# Patient Record
Sex: Female | Born: 1985 | Race: White | Hispanic: No | Marital: Single | Smoking: Former smoker
Health system: Southern US, Community
[De-identification: ages and names within clinical notes are randomized; demographics above are authoritative.]

## PROBLEM LIST (undated history)

## (undated) ENCOUNTER — Inpatient Hospital Stay (HOSPITAL_COMMUNITY): Payer: Self-pay

## (undated) DIAGNOSIS — Z789 Other specified health status: Secondary | ICD-10-CM

## (undated) DIAGNOSIS — J45909 Unspecified asthma, uncomplicated: Secondary | ICD-10-CM

## (undated) DIAGNOSIS — F329 Major depressive disorder, single episode, unspecified: Secondary | ICD-10-CM

## (undated) DIAGNOSIS — F32A Depression, unspecified: Secondary | ICD-10-CM

## (undated) DIAGNOSIS — E0590 Thyrotoxicosis, unspecified without thyrotoxic crisis or storm: Secondary | ICD-10-CM

## (undated) HISTORY — PX: ROTATOR CUFF REPAIR: SHX139

## (undated) HISTORY — DX: Thyrotoxicosis, unspecified without thyrotoxic crisis or storm: E05.90

---
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
## 2013-01-26 NOTE — L&D Delivery Note (Signed)
Delivery Note At [address] a healthy female was delivered via spontaneous vaginal delivery (Presentation: ROA;  ).    Placenta status: intact .  Cord: 3 vessel with the following complications: mild shoulder dystocia  Called to bedside when patient was complete and pushing. After 10 minutes, head was delivered OA over intact perineum. No nuchal cord. Shoulders were tight and McRobert's maneuver employed. Suprapubic pressure applied. Attempted to deliver posterior shoulder unsuccessful. Shoulder rotation employed and anterior shoulder delivered after 45 seconds. Posterior shoulder and body delivered immediately thereafter. Cord cut and clamped. Baby taken to warmer for stimulation. With gentle traction, cord and placenta were removed after 5 minutes. Fundal massage employed and patient had some moderate bleeding which slowed. Uterus was mildly boggy and cytotec was given 800mcg rectally. Bleeding slowed significantly. Vaginal laceration was repaired in the vaginal mucosa with 3-0 vicryl suture in 2 layer closure. Lap and instrument count correct. Bleeding was controlled. Mom and baby left in good condition. Ready for transfer to postpartum.  Anesthesia:  epidural Episiotomy: None Lacerations: 2nd degree posterior vaginal Suture Repair: 3.0 vicryl rapide Est. Blood Loss (mL): 350  Mom to postpartum.  Baby to Nursery.  Delivery attended by Dwyane DeeMarie Tritia Endo  Stephens, Devin A 08/26/2013, 3:10 AM  Attended and agree with note Shoulder delivered with SP pressure, then axilla hooked to deliver posterior shoulder Both arms moving spontaneously and equally Aviva SignsMarie L Lyal Husted, CNM

## 2013-04-13 ENCOUNTER — Other Ambulatory Visit (HOSPITAL_COMMUNITY): Payer: Self-pay | Admitting: Nurse Practitioner

## 2013-04-13 DIAGNOSIS — Z3689 Encounter for other specified antenatal screening: Secondary | ICD-10-CM

## 2013-04-13 LAB — OB RESULTS CONSOLE ABO/RH: RH Type: NEGATIVE

## 2013-04-13 LAB — OB RESULTS CONSOLE GC/CHLAMYDIA
CHLAMYDIA, DNA PROBE: NEGATIVE
Gonorrhea: NEGATIVE

## 2013-04-13 LAB — OB RESULTS CONSOLE HIV ANTIBODY (ROUTINE TESTING): HIV: NONREACTIVE

## 2013-04-13 LAB — OB RESULTS CONSOLE RPR: RPR: NONREACTIVE

## 2013-04-13 LAB — OB RESULTS CONSOLE ANTIBODY SCREEN: Antibody Screen: NEGATIVE

## 2013-04-13 LAB — OB RESULTS CONSOLE RUBELLA ANTIBODY, IGM: Rubella: IMMUNE

## 2013-04-13 LAB — OB RESULTS CONSOLE HEPATITIS B SURFACE ANTIGEN: Hepatitis B Surface Ag: NEGATIVE

## 2013-04-18 ENCOUNTER — Ambulatory Visit (HOSPITAL_COMMUNITY)
Admission: RE | Admit: 2013-04-18 | Discharge: 2013-04-18 | Disposition: A | Discharge status: Home / Self Care | Payer: Medicaid Other | Source: Ambulatory Visit | Attending: Nurse Practitioner | Admitting: Nurse Practitioner

## 2013-04-18 DIAGNOSIS — Z3689 Encounter for other specified antenatal screening: Secondary | ICD-10-CM | POA: Insufficient documentation

## 2013-04-18 DIAGNOSIS — O0930 Supervision of pregnancy with insufficient antenatal care, unspecified trimester: Secondary | ICD-10-CM | POA: Insufficient documentation

## 2013-06-25 ENCOUNTER — Encounter (HOSPITAL_COMMUNITY): Payer: Self-pay | Admitting: *Deleted

## 2013-06-25 ENCOUNTER — Inpatient Hospital Stay (HOSPITAL_COMMUNITY)
Admission: AD | Admit: 2013-06-25 | Discharge: 2013-06-25 | Disposition: A | Discharge status: Home / Self Care | Payer: Medicaid Other | Source: Ambulatory Visit | Attending: Obstetrics and Gynecology | Admitting: Obstetrics and Gynecology

## 2013-06-25 DIAGNOSIS — Z87891 Personal history of nicotine dependence: Secondary | ICD-10-CM | POA: Insufficient documentation

## 2013-06-25 DIAGNOSIS — O9989 Other specified diseases and conditions complicating pregnancy, childbirth and the puerperium: Principal | ICD-10-CM

## 2013-06-25 DIAGNOSIS — R109 Unspecified abdominal pain: Secondary | ICD-10-CM | POA: Insufficient documentation

## 2013-06-25 DIAGNOSIS — O99891 Other specified diseases and conditions complicating pregnancy: Secondary | ICD-10-CM | POA: Insufficient documentation

## 2013-06-25 DIAGNOSIS — N949 Unspecified condition associated with female genital organs and menstrual cycle: Secondary | ICD-10-CM

## 2013-06-25 DIAGNOSIS — K219 Gastro-esophageal reflux disease without esophagitis: Secondary | ICD-10-CM | POA: Insufficient documentation

## 2013-06-25 HISTORY — DX: Depression, unspecified: F32.A

## 2013-06-25 HISTORY — DX: Unspecified asthma, uncomplicated: J45.909

## 2013-06-25 HISTORY — DX: Major depressive disorder, single episode, unspecified: F32.9

## 2013-06-25 HISTORY — DX: Other specified health status: Z78.9

## 2013-06-25 LAB — URINALYSIS, ROUTINE W REFLEX MICROSCOPIC
Bilirubin Urine: NEGATIVE
Glucose, UA: NEGATIVE mg/dL
Hgb urine dipstick: NEGATIVE
Ketones, ur: NEGATIVE mg/dL
Leukocytes, UA: NEGATIVE
Nitrite: NEGATIVE
Protein, ur: NEGATIVE mg/dL
Specific Gravity, Urine: 1.01 (ref 1.005–1.030)
Urobilinogen, UA: 0.2 mg/dL (ref 0.0–1.0)
pH: 6.5 (ref 5.0–8.0)

## 2013-06-25 MED ORDER — CYCLOBENZAPRINE HCL 10 MG PO TABS: 10.0000 mg | ORAL_TABLET | Freq: Two times a day (BID) | ORAL | Status: DC | PRN

## 2013-06-25 NOTE — Discharge Instructions (Signed)
Preterm Labor Information Preterm labor is when labor starts at less than 37 weeks of pregnancy. The normal length of a pregnancy is 39 to 41 weeks. CAUSES Often, there is no identifiable underlying cause as to why a woman goes into preterm labor. One of the most common known causes of preterm labor is infection. Infections of the uterus, cervix, vagina, amniotic sac, bladder, kidney, or even the lungs (pneumonia) can cause labor to start. Other suspected causes of preterm labor include:   Urogenital infections, such as yeast infections and bacterial vaginosis.   Uterine abnormalities (uterine shape, uterine septum, fibroids, or bleeding from the placenta).   A cervix that has been operated on (it may fail to stay closed).   Malformations in the fetus.   Multiple gestations (twins, triplets, and so on).   Breakage of the amniotic sac.  RISK FACTORS  Having a previous history of preterm labor.   Having premature rupture of membranes (PROM).   Having a placenta that covers the opening of the cervix (placenta previa).   Having a placenta that separates from the uterus (placental abruption).   Having a cervix that is too weak to hold the fetus in the uterus (incompetent cervix).   Having too much fluid in the amniotic sac (polyhydramnios).   Taking illegal drugs or smoking while pregnant.   Not gaining enough weight while pregnant.   Being younger than 18 and older than 29 years old.   Having a low socioeconomic status.   Being African American. SYMPTOMS Signs and symptoms of preterm labor include:   Menstrual-like cramps, abdominal pain, or back pain.  Uterine contractions that are regular, as frequent as six in an hour, regardless of their intensity (may be mild or painful).  Contractions that start on the top of the uterus and spread down to the lower abdomen and back.   A sense of increased pelvic pressure.   A watery or bloody mucus discharge that  comes from the vagina.  TREATMENT Depending on the length of the pregnancy and other circumstances, your health care provider may suggest bed rest. If necessary, there are medicines that can be given to stop contractions and to mature the fetal lungs. If labor happens before 34 weeks of pregnancy, a prolonged hospital stay may be recommended. Treatment depends on the condition of both you and the fetus.  WHAT SHOULD YOU DO IF YOU THINK YOU ARE IN PRETERM LABOR? Call your health care provider right away. You will need to go to the hospital to get checked immediately. HOW CAN YOU PREVENT PRETERM LABOR IN FUTURE PREGNANCIES? You should:   Stop smoking if you smoke.  Maintain healthy weight gain and avoid chemicals and drugs that are not necessary.  Be watchful for any type of infection.  Inform your health care provider if you have a known history of preterm labor. Document Released: 04/04/2003 Document Revised: 09/14/2012 Document Reviewed: 02/15/2012 [employer] Patient Information 2014 [employer].    

## 2013-06-25 NOTE — MAU Note (Signed)
Pt. States she has noticed an increase in swelling in her legs and feet for the past 4 days. Also, contracting for a couple of days but today got worse and is here to be evaluated. Yesterday had a constant pain in her lower left side. Today right side started to hurt and has noticed a sharp pain that goes down her right leg. Pt. States it is hard to sit up. Denies any leakage of fluid. Denies bleeding. Baby has been moving well. Denies headache. Stacey George

## 2013-06-25 NOTE — MAU Note (Signed)
Pt presents to MAU with complaints of contractions that started a couple of days but have gotten worse today. Denies any vaginal bleeding of LOF

## 2013-06-25 NOTE — MAU Provider Note (Signed)
Attestation of Attending Supervision of Advanced Practitioner: Evaluation and management procedures were performed by the PA/NP/CNM/OB Fellow under my supervision/collaboration. Chart reviewed and agree with management and plan.  Tilda Burrow 06/25/2013 4:35 PM

## 2013-06-25 NOTE — MAU Provider Note (Signed)
None  Chief Complaint:  Contractions  Stacey George is  28 y.o. G1P0 at 6723w4d presents complaining of lower abdominal pain for the last 3 days. Pt reports she would have 6/10 sharp pain on her left or right lower quadrants. The pain will last 5-10 minutes and will improve with either stopping walking or repositioning if sitting down. She has not taken any medications for this pain. She also reports occasionally it will shoot down her right leg.    She denies contractions, vaginal bleeding, or leakage of fluid, with active fetal movement.  No HA, vision changes, CP, SOB, N/v, d/c/,f/c, urinary symptoms. +GERD, no other complaints  Obstetrical/Gynecological History: OB History   Grav Para Term Preterm Abortions TAB SAB Ect Mult Living   1              Past Medical History: Past Medical History  Diagnosis Date  . Medical history non-contributory   . Asthma     exercise induced  . Depression     Past Surgical History: Past Surgical History  Procedure Laterality Date  . Rotator cuff repair  2002 and 2004    Family History: History reviewed. No pertinent family history.  Social History: History  Substance Use Topics  . Smoking status: Former Games developermoker  . Smokeless tobacco: Not on file  . Alcohol Use: No    Allergies:  Allergies  Allergen Reactions  . Codeine Nausea Only and Other (See Comments)    Dizziness    Meds:  Prescriptions prior to admission  Medication Sig Dispense Refill  . Prenatal Vit-Fe Fumarate-FA (MULTIVITAMIN-PRENATAL) 27-0.8 MG TABS tablet Take 1 tablet by mouth daily at 12 noon.        Review of Systems -   Review of Systems  Constitutional: Negative for fever, chills, weight loss, malaise/fatigue and diaphoresis.  HENT: Negative for hearing loss, ear pain, nosebleeds, congestion, sore throat, neck pain, tinnitus and ear discharge.   Eyes: Negative for blurred vision, double vision, photophobia, pain, discharge and redness.  Respiratory: Negative  for cough, hemoptysis, sputum production, shortness of breath, wheezing and stridor.   Cardiovascular: Negative for chest pain, palpitations, orthopnea,  leg swelling  Gastrointestinal: Negative for abdominal pain heartburn, nausea, vomiting, diarrhea, constipation, blood in stool Genitourinary: Negative for dysuria, urgency, frequency, hematuria and flank pain.  Musculoskeletal: Negative for myalgias, back pain, joint pain and falls.  Skin: Negative for itching and rash.  Neurological: Negative for dizziness, tingling, tremors, sensory change, speech change, focal weakness, seizures, loss of consciousness, weakness and headaches.  Endo/Heme/Allergies: Negative for environmental allergies and polydipsia. Does not bruise/bleed easily.  Psychiatric/Behavioral: Negative for depression, suicidal ideas, hallucinations, memory loss and substance abuse. The patient is not nervous/anxious and does not have insomnia.      Physical Exam  Blood pressure 123/74, pulse 100, temperature 98.1 F (36.7 C), resp. rate 18, height 5\' 4"  (1.626 m), weight 88.905 kg (196 lb), last menstrual period 11/09/2012. GENERAL: Well-developed, well-nourished female in no acute distress.  LUNGS: Clear to auscultation bilaterally.  HEART: Regular rate and rhythm. ABDOMEN: Soft, nontender, nondistended, gravid.  EXTREMITIES: Nontender, no edema, 2+ distal pulses. DTR's 2+ Dilation: Closed Effacement (%): Thick Exam by:: Dr. Ike Benedom  FHT:  Baseline rate 150s bpm   Variability moderate  Accelerations present   Decelerations none Contractions: Uterine irritablity   Labs: Results for orders placed during the hospital encounter of 06/25/13 (from the past 24 hour(s))  URINALYSIS, ROUTINE W REFLEX MICROSCOPIC   Collection Time  06/25/13  2:00 PM      Result Value Ref Range   Color, Urine YELLOW  YELLOW   APPearance CLEAR  CLEAR   Specific Gravity, Urine 1.010  1.005 - 1.030   pH 6.5  5.0 - 8.0   Glucose, UA NEGATIVE   NEGATIVE mg/dL   Hgb urine dipstick NEGATIVE  NEGATIVE   Bilirubin Urine NEGATIVE  NEGATIVE   Ketones, ur NEGATIVE  NEGATIVE mg/dL   Protein, ur NEGATIVE  NEGATIVE mg/dL   Urobilinogen, UA 0.2  0.0 - 1.0 mg/dL   Nitrite NEGATIVE  NEGATIVE   Leukocytes, UA NEGATIVE  NEGATIVE   Imaging Studies:  No results found.  Assessment: Stacey George is  28 y.o. G1P0 at [redacted]w[redacted]d presents with pain most consistent with MSK vs Round ligament. Recommend pregnancy belt and Flexeril PRN. Continue PNC with HD. Return for PTL  Minta Balsam 5/31/20152:35 PM

## 2013-07-25 LAB — OB RESULTS CONSOLE GBS: STREP GROUP B AG: POSITIVE

## 2013-08-17 ENCOUNTER — Other Ambulatory Visit (HOSPITAL_COMMUNITY): Payer: Self-pay | Admitting: Nurse Practitioner

## 2013-08-17 DIAGNOSIS — O480 Post-term pregnancy: Secondary | ICD-10-CM

## 2013-08-21 ENCOUNTER — Ambulatory Visit (HOSPITAL_COMMUNITY)
Admission: RE | Admit: 2013-08-21 | Discharge: 2013-08-21 | Disposition: A | Discharge status: Home / Self Care | Payer: Medicaid Other | Source: Ambulatory Visit | Attending: Nurse Practitioner | Admitting: Nurse Practitioner

## 2013-08-21 DIAGNOSIS — Z3689 Encounter for other specified antenatal screening: Secondary | ICD-10-CM | POA: Diagnosis not present

## 2013-08-21 DIAGNOSIS — O480 Post-term pregnancy: Secondary | ICD-10-CM | POA: Insufficient documentation

## 2013-08-23 ENCOUNTER — Encounter (HOSPITAL_COMMUNITY): Payer: Self-pay | Admitting: *Deleted

## 2013-08-23 ENCOUNTER — Telehealth (HOSPITAL_COMMUNITY): Payer: Self-pay | Admitting: *Deleted

## 2013-08-23 NOTE — Telephone Encounter (Signed)
Preadmission screen  

## 2013-08-24 ENCOUNTER — Inpatient Hospital Stay (HOSPITAL_COMMUNITY)
Admission: AD | Admit: 2013-08-24 | Discharge: 2013-08-27 | DRG: 775 | Disposition: A | Discharge status: Home / Self Care | Payer: Medicaid Other | Source: Ambulatory Visit | Attending: Family Medicine | Admitting: Family Medicine

## 2013-08-24 ENCOUNTER — Encounter (HOSPITAL_COMMUNITY): Payer: Self-pay | Admitting: *Deleted

## 2013-08-24 DIAGNOSIS — O99892 Other specified diseases and conditions complicating childbirth: Secondary | ICD-10-CM | POA: Diagnosis present

## 2013-08-24 DIAGNOSIS — O99344 Other mental disorders complicating childbirth: Secondary | ICD-10-CM | POA: Diagnosis present

## 2013-08-24 DIAGNOSIS — O480 Post-term pregnancy: Secondary | ICD-10-CM | POA: Diagnosis present

## 2013-08-24 DIAGNOSIS — J45909 Unspecified asthma, uncomplicated: Secondary | ICD-10-CM | POA: Diagnosis present

## 2013-08-24 DIAGNOSIS — Z6379 Other stressful life events affecting family and household: Secondary | ICD-10-CM

## 2013-08-24 DIAGNOSIS — D649 Anemia, unspecified: Secondary | ICD-10-CM | POA: Diagnosis present

## 2013-08-24 DIAGNOSIS — O9912 Other diseases of the blood and blood-forming organs and certain disorders involving the immune mechanism complicating childbirth: Secondary | ICD-10-CM

## 2013-08-24 DIAGNOSIS — F3289 Other specified depressive episodes: Secondary | ICD-10-CM | POA: Diagnosis present

## 2013-08-24 DIAGNOSIS — Z22330 Carrier of Group B streptococcus: Secondary | ICD-10-CM | POA: Diagnosis not present

## 2013-08-24 DIAGNOSIS — O9902 Anemia complicating childbirth: Secondary | ICD-10-CM | POA: Diagnosis present

## 2013-08-24 DIAGNOSIS — Z87891 Personal history of nicotine dependence: Secondary | ICD-10-CM

## 2013-08-24 DIAGNOSIS — O9989 Other specified diseases and conditions complicating pregnancy, childbirth and the puerperium: Secondary | ICD-10-CM

## 2013-08-24 DIAGNOSIS — E0590 Thyrotoxicosis, unspecified without thyrotoxic crisis or storm: Secondary | ICD-10-CM | POA: Diagnosis present

## 2013-08-24 DIAGNOSIS — Z8249 Family history of ischemic heart disease and other diseases of the circulatory system: Secondary | ICD-10-CM

## 2013-08-24 DIAGNOSIS — D689 Coagulation defect, unspecified: Secondary | ICD-10-CM | POA: Diagnosis present

## 2013-08-24 DIAGNOSIS — Z825 Family history of asthma and other chronic lower respiratory diseases: Secondary | ICD-10-CM

## 2013-08-24 DIAGNOSIS — E079 Disorder of thyroid, unspecified: Secondary | ICD-10-CM | POA: Diagnosis present

## 2013-08-24 DIAGNOSIS — F329 Major depressive disorder, single episode, unspecified: Secondary | ICD-10-CM | POA: Diagnosis present

## 2013-08-24 DIAGNOSIS — O368390 Maternal care for abnormalities of the fetal heart rate or rhythm, unspecified trimester, not applicable or unspecified: Secondary | ICD-10-CM | POA: Diagnosis present

## 2013-08-24 DIAGNOSIS — O481 Prolonged pregnancy: Secondary | ICD-10-CM | POA: Diagnosis present

## 2013-08-24 DIAGNOSIS — O99284 Endocrine, nutritional and metabolic diseases complicating childbirth: Secondary | ICD-10-CM

## 2013-08-24 LAB — CBC
HEMATOCRIT: 27.3 % — AB (ref 36.0–46.0)
Hemoglobin: 8.5 g/dL — ABNORMAL LOW (ref 12.0–15.0)
MCH: 22.9 pg — ABNORMAL LOW (ref 26.0–34.0)
MCHC: 31.1 g/dL (ref 30.0–36.0)
MCV: 73.6 fL — ABNORMAL LOW (ref 78.0–100.0)
Platelets: 355 10*3/uL (ref 150–400)
RBC: 3.71 MIL/uL — ABNORMAL LOW (ref 3.87–5.11)
RDW: 16.9 % — ABNORMAL HIGH (ref 11.5–15.5)
WBC: 10.2 10*3/uL (ref 4.0–10.5)

## 2013-08-24 LAB — RPR

## 2013-08-24 MED ORDER — FLEET ENEMA 7-19 GM/118ML RE ENEM: 1.0000 | ENEMA | Freq: Every day | RECTAL | Status: DC | PRN

## 2013-08-24 MED ORDER — LACTATED RINGERS IV SOLN
500.0000 mL | INTRAVENOUS | Status: DC | PRN
  Administered 2013-08-25: 500 mL via INTRAVENOUS

## 2013-08-24 MED ORDER — ONDANSETRON HCL 4 MG/2ML IJ SOLN: 4.0000 mg | Freq: Four times a day (QID) | INTRAMUSCULAR | Status: DC | PRN

## 2013-08-24 MED ORDER — LACTATED RINGERS IV SOLN
INTRAVENOUS | Status: DC
  Administered 2013-08-24 – 2013-08-26 (×6): via INTRAVENOUS

## 2013-08-24 MED ORDER — PENICILLIN G POTASSIUM 5000000 UNITS IJ SOLR
2.5000 10*6.[IU] | INTRAVENOUS | Status: DC
  Filled 2013-08-24 (×2): qty 2.5

## 2013-08-24 MED ORDER — OXYTOCIN BOLUS FROM INFUSION
500.0000 mL | INTRAVENOUS | Status: DC
  Administered 2013-08-26: 500 mL via INTRAVENOUS

## 2013-08-24 MED ORDER — FENTANYL CITRATE 0.05 MG/ML IJ SOLN
100.0000 ug | INTRAMUSCULAR | Status: DC | PRN
  Administered 2013-08-24 – 2013-08-25 (×4): 100 ug via INTRAVENOUS
  Filled 2013-08-24 (×5): qty 2

## 2013-08-24 MED ORDER — ACETAMINOPHEN 325 MG PO TABS: 650.0000 mg | ORAL_TABLET | ORAL | Status: DC | PRN

## 2013-08-24 MED ORDER — OXYCODONE-ACETAMINOPHEN 5-325 MG PO TABS: 1.0000 | ORAL_TABLET | ORAL | Status: DC | PRN

## 2013-08-24 MED ORDER — ZOLPIDEM TARTRATE 5 MG PO TABS
5.0000 mg | ORAL_TABLET | Freq: Every evening | ORAL | Status: DC | PRN
  Administered 2013-08-25: 5 mg via ORAL
  Filled 2013-08-24: qty 1

## 2013-08-24 MED ORDER — PENICILLIN G POTASSIUM 5000000 UNITS IJ SOLR
2.5000 10*6.[IU] | INTRAVENOUS | Status: DC
  Administered 2013-08-25 – 2013-08-26 (×4): 2.5 10*6.[IU] via INTRAVENOUS
  Filled 2013-08-24 (×8): qty 2.5

## 2013-08-24 MED ORDER — LIDOCAINE HCL (PF) 1 % IJ SOLN
30.0000 mL | INTRAMUSCULAR | Status: DC | PRN
  Filled 2013-08-24: qty 30

## 2013-08-24 MED ORDER — PENICILLIN G POTASSIUM 5000000 UNITS IJ SOLR
5.0000 10*6.[IU] | Freq: Once | INTRAVENOUS | Status: AC
  Administered 2013-08-25: 5 10*6.[IU] via INTRAVENOUS
  Filled 2013-08-24: qty 5

## 2013-08-24 MED ORDER — PENICILLIN G POTASSIUM 5000000 UNITS IJ SOLR
5.0000 10*6.[IU] | Freq: Once | INTRAVENOUS | Status: DC
  Filled 2013-08-24: qty 5

## 2013-08-24 MED ORDER — OXYTOCIN 40 UNITS IN LACTATED RINGERS INFUSION - SIMPLE MED: 62.5000 mL/h | INTRAVENOUS | Status: DC

## 2013-08-24 MED ORDER — CITRIC ACID-SODIUM CITRATE 334-500 MG/5ML PO SOLN
30.0000 mL | ORAL | Status: DC | PRN
  Administered 2013-08-25: 30 mL via ORAL
  Filled 2013-08-24: qty 15

## 2013-08-24 MED ORDER — IBUPROFEN 600 MG PO TABS
600.0000 mg | ORAL_TABLET | Freq: Four times a day (QID) | ORAL | Status: DC | PRN
  Administered 2013-08-26: 600 mg via ORAL
  Filled 2013-08-24: qty 1

## 2013-08-24 NOTE — MAU Note (Signed)
[facility] Pt states she was being monitored and fetal heart rate was 180-190  For more than 

## 2013-08-24 NOTE — H&P (Signed)
Attestation of Attending Supervision of Advanced Practitioner (CNM/NP): Evaluation and management procedures were performed by the Advanced Practitioner under my supervision and collaboration.  I have reviewed the Advanced Practitioner's note and chart, and I agree with the management and plan.  Posie Lillibridge 08/24/2013 8:37 PM   

## 2013-08-24 NOTE — H&P (Signed)
LABOR ADMISSION HISTORY AND PHYSICAL  Stacey George is a 28 y.o. female G1P0 with IUP at [redacted]w[redacted]d by LMP c/w with 22w sono presenting for fetal heart tones 180s-190s in clinic today for about 10 minutes, NST initially nonreactive, after discussing with patient she agreed to be admitted for IOL for prolonged pregnancy. She reports +FMs, No LOF, no VB, no blurry vision, headaches or peripheral edema, and RUQ pain. She desires an epidural for labor pain control. She plans on breast and bottle feeding. She declines birth control.  Dating: By LMP --->  Estimated Date of Delivery: 08/16/13   Prenatal History/Complications:  Past Medical History: Past Medical History  Diagnosis Date  . Medical history non-contributory   . Asthma     exercise induced  . Depression   . Hyperthyroidism     Past Surgical History: Past Surgical History  Procedure Laterality Date  . Rotator cuff repair  2002 and 2004    Obstetrical History: OB History   Grav Para Term Preterm Abortions TAB SAB Ect Mult Living   1               Social History: History   Social History  . Marital Status: Single    Spouse Name: N/A    Number of Children: N/A  . Years of Education: N/A   Social History Main Topics  . Smoking status: Former Games developer  . Smokeless tobacco: None  . Alcohol Use: No  . Drug Use: No  . Sexual Activity: None   Other Topics Concern  . None   Social History Narrative  . None    Family History: Family History  Problem Relation Age of Onset  . Asthma Mother   . Hypertension Mother   . Hypothyroidism Mother   . Fibromyalgia Mother   . Hypertension Father   . Drug abuse Maternal Grandmother   . Hypertension Paternal Grandmother   . Drug abuse Paternal Grandmother   . Cancer Paternal Grandmother   . Hypertension Paternal Grandfather   . Cancer Paternal Grandfather     Allergies: Allergies  Allergen Reactions  . Codeine Nausea Only and Other (See Comments)    Dizziness     Prescriptions prior to admission  Medication Sig Dispense Refill  . acetaminophen (TYLENOL) 500 MG tablet Take 1,500 mg by mouth every 6 (six) hours as needed for headache (Used for severe headache.).      Marland Kitchen calcium carbonate (TUMS - DOSED IN MG ELEMENTAL CALCIUM) 500 MG chewable tablet Chew 2 tablets by mouth daily as needed for indigestion or heartburn.      . Prenatal Vit-Fe Fumarate-FA (MULTIVITAMIN-PRENATAL) 27-0.8 MG TABS tablet Take 1 tablet by mouth daily at 12 noon.         Review of Systems   All systems reviewed and negative except as stated in HPI  Blood pressure 125/88, pulse 97, temperature 98.1 F (36.7 C), temperature source Oral, resp. rate 18, height 5\' 4"  (1.626 m), weight 95.255 kg (210 lb), last menstrual period 11/09/2012. General appearance: alert and cooperative Lungs: clear to auscultation bilaterally Heart: regular rate and rhythm Abdomen: soft, non-tender; bowel sounds normal Pelvic: 2/70/-3, VERY soft, anterior Extremities: Homans sign is negative, no sign of DVT DTR's not examined Presentation: cephalic Fetal monitoringBaseline: 150 bpm, mod variability, initially no accels but now with accels, no decels Uterine activityNone Dilation: 2 Effacement (%): 60 Station: -3 Exam by:: Dr Loreta Ave   Prenatal labs: ABO, Rh: A/Negative/-- (03/19 0000)  Antibody: Negative (03/19 0000)  Rubella:   immune RPR: Nonreactive (03/19 0000)  HBsAg: Negative (03/19 0000)  HIV: Non-reactive (03/19 0000)  GBS: Positive (06/30 0000)  1 hr Glucola 124 Genetic screening  Too late Anatomy US normal   Prenatal Transfer Tool  Maternal Diabetes: No Genetic Screening: Declined Maternal Ultrasounds/Referrals: Normal Fetal Ultrasounds or other Referrals:  None Maternal Substance Abuse:  Yes:  Type: Smoker Significant Maternal Medications:  None Significant Maternal Lab Results: None     Results for orders placed during the hospital encounter of 08/24/13 (from the  past 24 hour(s))  CBC   Collection Time    08/24/13  5:25 PM      Result Value Ref Range   WBC 10.2  4.0 - 10.5 K/uL   RBC 3.71 (*) 3.87 - 5.11 MIL/uL   Hemoglobin 8.5 (*) 12.0 - 15.0 g/dL   HCT 16.127.3 (*) 09.636.0 - 04.546.0 %   MCV 73.6 (*) 78.0 - 100.0 fL   MCH 22.9 (*) 26.0 - 34.0 pg   MCHC 31.1  30.0 - 36.0 g/dL   RDW 40.916.9 (*) 81.111.5 - 91.415.5 %   Platelets 355  150 - 400 K/uL    Patient Active Problem List   Diagnosis Date Noted  . Prolonged pregnancy 08/24/2013    Assessment: Stacey George is a 28 y.o. G1P0 at 2676w1d here for IOL 2/2 prolonged pregnancy and nonreactive NST   #Labor: IOL with FB at 1830 #Pain: Epidural upon request #FWB: Cat 1 #ID:  PCN #MOF: breast #MOC:declines  Stacey George 08/24/2013, 7:21 PM

## 2013-08-25 ENCOUNTER — Encounter (HOSPITAL_COMMUNITY): Payer: Medicaid Other | Admitting: Anesthesiology

## 2013-08-25 ENCOUNTER — Inpatient Hospital Stay (HOSPITAL_COMMUNITY): Payer: Medicaid Other | Admitting: Anesthesiology

## 2013-08-25 MED ORDER — DIPHENHYDRAMINE HCL 50 MG/ML IJ SOLN: 12.5000 mg | INTRAMUSCULAR | Status: DC | PRN

## 2013-08-25 MED ORDER — LACTATED RINGERS IV SOLN
500.0000 mL | Freq: Once | INTRAVENOUS | Status: AC
  Administered 2013-08-25: 500 mL via INTRAVENOUS

## 2013-08-25 MED ORDER — OXYTOCIN 40 UNITS IN LACTATED RINGERS INFUSION - SIMPLE MED
1.0000 m[IU]/min | INTRAVENOUS | Status: DC
  Administered 2013-08-25: 2 m[IU]/min via INTRAVENOUS
  Filled 2013-08-25: qty 1000

## 2013-08-25 MED ORDER — FENTANYL CITRATE 0.05 MG/ML IJ SOLN
100.0000 ug | INTRAMUSCULAR | Status: DC | PRN
  Administered 2013-08-25: 100 ug via INTRAVENOUS

## 2013-08-25 MED ORDER — LIDOCAINE HCL (PF) 1 % IJ SOLN
INTRAMUSCULAR | Status: DC | PRN
  Administered 2013-08-25 (×4): 4 mL

## 2013-08-25 MED ORDER — EPHEDRINE 5 MG/ML INJ
10.0000 mg | INTRAVENOUS | Status: DC | PRN
  Filled 2013-08-25: qty 2

## 2013-08-25 MED ORDER — PHENYLEPHRINE 40 MCG/ML (10ML) SYRINGE FOR IV PUSH (FOR BLOOD PRESSURE SUPPORT)
80.0000 ug | PREFILLED_SYRINGE | INTRAVENOUS | Status: DC | PRN
  Filled 2013-08-25: qty 10
  Filled 2013-08-25: qty 2

## 2013-08-25 MED ORDER — ZOLPIDEM TARTRATE 5 MG PO TABS: 5.0000 mg | ORAL_TABLET | Freq: Every evening | ORAL | Status: DC | PRN

## 2013-08-25 MED ORDER — FENTANYL 2.5 MCG/ML BUPIVACAINE 1/10 % EPIDURAL INFUSION (WH - ANES)
14.0000 mL/h | INTRAMUSCULAR | Status: DC | PRN
  Administered 2013-08-25 – 2013-08-26 (×3): 14 mL/h via EPIDURAL
  Filled 2013-08-25 (×3): qty 125

## 2013-08-25 MED ORDER — PHENYLEPHRINE 40 MCG/ML (10ML) SYRINGE FOR IV PUSH (FOR BLOOD PRESSURE SUPPORT)
80.0000 ug | PREFILLED_SYRINGE | INTRAVENOUS | Status: DC | PRN
  Filled 2013-08-25: qty 2

## 2013-08-25 NOTE — Progress Notes (Signed)
Stacey George is a [age] y.o. G1P0 at 6519w2d by ultrasound admitted for induction of labor due to Post dates. .  Subjective: Comfortable  Objective: BP 130/74  Pulse 103  Temp(Src) 98.2 F (36.8 C) (Oral)  Resp 18  Ht 5\' 4"  (1.626 m)  Wt 95.255 kg (210 lb)  BMI 36.03 kg/m2  SpO2 100%  LMP 11/09/2012 I/O last 3 completed shifts: In: 3372.6 [I.V.:3372.6] Out: 700 [Urine:700] Total I/O In: 197.5 [I.V.:197.5] Out: -   FHT:  FHR: 140 bpm, variability: moderate,  accelerations:  Present,  decelerations:  Absent UC:   regular, every 3 minutes SVE:   Dilation: 5.5 Effacement (%): 80 Station: -1 Exam by:: L.Stubbs, RN  Labs: Lab Results  Component Value Date   WBC 10.2 08/24/2013   HGB 8.5* 08/24/2013   HCT 27.3* 08/24/2013   MCV 73.6* 08/24/2013   PLT 355 08/24/2013    Assessment / Plan: Induction of labor due to postterm,  progressing well on pitocin  Labor: Progressing normally Preeclampsia:  n/a Fetal Wellbeing:  Category I Pain Control:  Epidural I/D:  n/a Anticipated MOD:  NSVD  Stacey George 08/25/2013, 9:27 PM

## 2013-08-25 NOTE — Anesthesia Procedure Notes (Signed)
Epidural Patient location during procedure: OB Start time: 08/25/2013 4:20 PM  Staffing Performed by: anesthesiologist   Preanesthetic Checklist Completed: patient identified, site marked, surgical consent, pre-op evaluation, timeout performed, IV checked, risks and benefits discussed and monitors and equipment checked  Epidural Patient position: sitting Prep: site prepped and draped and DuraPrep Patient monitoring: continuous pulse ox and blood pressure Approach: midline Injection technique: LOR air  Needle:  Needle type: Tuohy  Needle gauge: 17 G Needle length: 9 cm and 9 Needle insertion depth: 6 cm Catheter type: closed end flexible Catheter size: 19 Gauge Catheter at skin depth: 11 cm Test dose: negative  Assessment Events: blood not aspirated, injection not painful, no injection resistance, negative IV test and no paresthesia  Additional Notes Discussed risk of headache, infection, bleeding, nerve injury and failed or incomplete block.  Patient voices understanding and wishes to proceed.  Epidural placed easily on first attempt.  No paresthesia.  Patient tolerated procedure well with no apparent complications. Jasmine DecemberA. Georgia Delsignore, MDReason for block:procedure for pain

## 2013-08-25 NOTE — Progress Notes (Signed)
  Subjective: Reports lower back pain and requesting IV pain med if pitocin to be increased  Objective: BP 117/59  Pulse 99  Temp(Src) 98.5 F (36.9 C) (Oral)  Resp 20  Ht 5\' 4"  (1.626 m)  Wt 95.255 kg (210 lb)  BMI 36.03 kg/m2  SpO2 100%  LMP 11/09/2012 I/O last 3 completed shifts: In: -  Out: 700 [Urine:700]    FHT:  FHR: 150's bpm, variability: moderate,  accelerations:  Present,  decelerations:  Absent UC:   Regular q3-464min SVE:   Dilation: 4 Effacement (%): 80 Station: -1 Exam by:: Loreta AveAcosta, MD IUPC placed without difficulty  Labs: Lab Results  Component Value Date   WBC 10.2 08/24/2013   HGB 8.5* 08/24/2013   HCT 27.3* 08/24/2013   MCV 73.6* 08/24/2013   PLT 355 08/24/2013    Assessment / Plan: 28 yo G1P0 at 5157w2d wks IUP IOL for postdates and nonreactive NST  Labor: Progressing normally , continue pitocin Preeclampsia:  n/a Fetal Wellbeing:  Category I Pain Control:  IV pain meds. I/D:  GBS pos Anticipated MOD:  NSVD  Stacey George 08/25/2013, 7:47 PM

## 2013-08-25 NOTE — Progress Notes (Signed)
  Subjective: Reports lower back pain and requesting IV pain med if pitocin to be increased  Objective: BP 118/68  Pulse 101  Temp(Src) 97.7 F (36.5 C) (Oral)  Resp 18  Ht 5\' 4"  (1.626 m)  Wt 95.255 kg (210 lb)  BMI 36.03 kg/m2  LMP 11/09/2012      FHT:  FHR: 150's bpm, variability: moderate,  accelerations:  Present,  decelerations:  Absent UC:   Regular q3-754min SVE:   Dilation: 4 Effacement (%): 80 Station: -3 Exam by:: Loreta AveAcosta, MD  Labs: Lab Results  Component Value Date   WBC 10.2 08/24/2013   HGB 8.5* 08/24/2013   HCT 27.3* 08/24/2013   MCV 73.6* 08/24/2013   PLT 355 08/24/2013    Assessment / Plan: 28 yo G1P0 at 8119w2d wks IUP IOL for postdates and nonreactive NST  Labor: Progressing normally , continue pitocin Preeclampsia:  n/a Fetal Wellbeing:  Category I Pain Control:  IV pain meds. I/D:  GBS pos Anticipated MOD:  NSVD  Stacey George ROCIO 08/25/2013, 1:25 PM

## 2013-08-25 NOTE — Progress Notes (Signed)
   Stacey George is a 28 y.o. G1P0 at 3850w2d  admitted for induction of labor due to Post dates..  Subjective:  sleeping Objective: Filed Vitals:   08/24/13 2010 08/24/13 2239 08/25/13 0105 08/25/13 0508  BP: 110/65 118/74 108/64 116/71  Pulse: 97 100 101 97  Temp:  97.9 F (36.6 C) 98.5 F (36.9 C) 97.5 F (36.4 C)  TempSrc:  Oral Oral Oral  Resp: 18 18 18 18   Height:      Weight:          FHT:  FHR: 140 bpm, variability: moderate,  accelerations:  Present,  decelerations:  Absent UC:   rare SVE: deferred.  Foley still in  Labs: Lab Results  Component Value Date   WBC 10.2 08/24/2013   HGB 8.5* 08/24/2013   HCT 27.3* 08/24/2013   MCV 73.6* 08/24/2013   PLT 355 08/24/2013    Assessment / Plan: ripening phase  Labor: ripening phase Fetal Wellbeing:  Category I Pain Control:  Labor support without medications Anticipated MOD:  NSVD  CRESENZO-DISHMAN,Nyashia Raney 08/25/2013, 5:41 AM

## 2013-08-25 NOTE — Progress Notes (Addendum)
  Subjective: Pt denies feeling any contractions or cramping.  Reports lower back pain and requesting IV pain med.    Objective: BP 102/51  Pulse 94  Temp(Src) 98.2 F (36.8 C) (Oral)  Resp 16  Ht 5\' 4"  (1.626 m)  Wt 95.255 kg (210 lb)  BMI 36.03 kg/m2  LMP 11/09/2012      FHT:  FHR: 120's bpm, variability: moderate,  accelerations:  Present,  decelerations:  Absent UC:   irregular, every 2-8 minutes SVE:   Dilation: 3.5 Effacement (%): Thick Station: -3 Exam by:: Muhammad, CNM ; foley bulb removed  Labs: Lab Results  Component Value Date   WBC 10.2 08/24/2013   HGB 8.5* 08/24/2013   HCT 27.3* 08/24/2013   MCV 73.6* 08/24/2013   PLT 355 08/24/2013    Assessment / Plan: 28 yo G1P0 at 6255w2d wks IUP IOL for postdates and nonreactive NST  Labor: Progressing normally; Begin pitocin augmentation Preeclampsia:  George/a Fetal Wellbeing:  Category I Pain Control:  IV pain meds. I/D:  GBS pos Anticipated MOD:  NSVD  Stacey George, Stacey George 08/25/2013, 9:09 AM

## 2013-08-25 NOTE — Anesthesia Preprocedure Evaluation (Addendum)
Anesthesia Evaluation  Patient identified by MRN, date of birth, ID band Patient awake    Reviewed: Allergy & Precautions, H&P , NPO status , Patient's Chart, lab work & pertinent test results, reviewed documented beta blocker date and time   History of Anesthesia Complications Negative for: history of anesthetic complications  Airway Mallampati: I TM Distance: >3 FB Neck ROM: full    Dental  (+) Teeth Intact   Pulmonary asthma (exercise-induced) , former smoker,  breath sounds clear to auscultation        Cardiovascular negative cardio ROS  Rhythm:regular Rate:Normal     Neuro/Psych Depression negative neurological ROS     GI/Hepatic negative GI ROS, Neg liver ROS,   Endo/Other  Hyperthyroidism BMI 36.1  Renal/GU negative Renal ROS     Musculoskeletal   Abdominal   Peds  Hematology  (+) Blood dyscrasia (hgb 8.5), anemia ,   Anesthesia Other Findings   Reproductive/Obstetrics (+) Pregnancy                          Anesthesia Physical Anesthesia Plan  ASA: II  Anesthesia Plan: Epidural   Post-op Pain Management:    Induction:   Airway Management Planned:   Additional Equipment:   Intra-op Plan:   Post-operative Plan:   Informed Consent: I have reviewed the patients History and Physical, chart, labs and discussed the procedure including the risks, benefits and alternatives for the proposed anesthesia with the patient or authorized representative who has indicated his/her understanding and acceptance.     Plan Discussed with:   Anesthesia Plan Comments:         Anesthesia Quick Evaluation

## 2013-08-26 ENCOUNTER — Encounter (HOSPITAL_COMMUNITY): Payer: Self-pay

## 2013-08-26 ENCOUNTER — Inpatient Hospital Stay (HOSPITAL_COMMUNITY): Admission: RE | Admit: 2013-08-26 | Payer: Medicaid Other | Source: Ambulatory Visit

## 2013-08-26 DIAGNOSIS — O480 Post-term pregnancy: Secondary | ICD-10-CM

## 2013-08-26 DIAGNOSIS — O9912 Other diseases of the blood and blood-forming organs and certain disorders involving the immune mechanism complicating childbirth: Secondary | ICD-10-CM

## 2013-08-26 DIAGNOSIS — D689 Coagulation defect, unspecified: Secondary | ICD-10-CM

## 2013-08-26 LAB — CBC
HEMATOCRIT: 22.9 % — AB (ref 36.0–46.0)
HEMOGLOBIN: 7.2 g/dL — AB (ref 12.0–15.0)
MCH: 22.9 pg — ABNORMAL LOW (ref 26.0–34.0)
MCHC: 31.4 g/dL (ref 30.0–36.0)
MCV: 72.9 fL — ABNORMAL LOW (ref 78.0–100.0)
Platelets: 298 10*3/uL (ref 150–400)
RBC: 3.14 MIL/uL — ABNORMAL LOW (ref 3.87–5.11)
RDW: 17.1 % — ABNORMAL HIGH (ref 11.5–15.5)
WBC: 27.4 10*3/uL — AB (ref 4.0–10.5)

## 2013-08-26 MED ORDER — DIPHENHYDRAMINE HCL 25 MG PO CAPS
25.0000 mg | ORAL_CAPSULE | Freq: Four times a day (QID) | ORAL | Status: DC | PRN
Start: 2013-08-26 — End: 2013-08-27

## 2013-08-26 MED ORDER — LANOLIN HYDROUS EX OINT: TOPICAL_OINTMENT | CUTANEOUS | Status: DC | PRN

## 2013-08-26 MED ORDER — ONDANSETRON HCL 4 MG PO TABS: 4.0000 mg | ORAL_TABLET | ORAL | Status: DC | PRN

## 2013-08-26 MED ORDER — SIMETHICONE 80 MG PO CHEW: 80.0000 mg | CHEWABLE_TABLET | ORAL | Status: DC | PRN

## 2013-08-26 MED ORDER — MISOPROSTOL 200 MCG PO TABS
ORAL_TABLET | ORAL | Status: AC
  Filled 2013-08-26: qty 4

## 2013-08-26 MED ORDER — OXYCODONE-ACETAMINOPHEN 5-325 MG PO TABS
1.0000 | ORAL_TABLET | ORAL | Status: DC | PRN
  Administered 2013-08-26: 1 via ORAL
  Administered 2013-08-26: 2 via ORAL
  Administered 2013-08-26: 1 via ORAL
  Administered 2013-08-27: 2 via ORAL
  Filled 2013-08-26: qty 2
  Filled 2013-08-26 (×2): qty 1
  Filled 2013-08-26: qty 2

## 2013-08-26 MED ORDER — TETANUS-DIPHTH-ACELL PERTUSSIS 5-2.5-18.5 LF-MCG/0.5 IM SUSP: 0.5000 mL | Freq: Once | INTRAMUSCULAR | Status: DC

## 2013-08-26 MED ORDER — DIBUCAINE 1 % RE OINT: 1.0000 "application " | TOPICAL_OINTMENT | RECTAL | Status: DC | PRN

## 2013-08-26 MED ORDER — ONDANSETRON HCL 4 MG/2ML IJ SOLN: 4.0000 mg | INTRAMUSCULAR | Status: DC | PRN

## 2013-08-26 MED ORDER — PRENATAL MULTIVITAMIN CH
1.0000 | ORAL_TABLET | Freq: Every day | ORAL | Status: DC
  Administered 2013-08-26 – 2013-08-27 (×2): 1 via ORAL
  Filled 2013-08-26 (×2): qty 1

## 2013-08-26 MED ORDER — IBUPROFEN 600 MG PO TABS
600.0000 mg | ORAL_TABLET | Freq: Four times a day (QID) | ORAL | Status: DC
Start: 2013-08-26 — End: 2013-08-27
  Administered 2013-08-26 – 2013-08-27 (×5): 600 mg via ORAL
  Filled 2013-08-26 (×5): qty 1

## 2013-08-26 MED ORDER — WITCH HAZEL-GLYCERIN EX PADS: 1.0000 "application " | MEDICATED_PAD | CUTANEOUS | Status: DC | PRN

## 2013-08-26 MED ORDER — BENZOCAINE-MENTHOL 20-0.5 % EX AERO
1.0000 "application " | INHALATION_SPRAY | CUTANEOUS | Status: DC | PRN
  Administered 2013-08-26: 1 via TOPICAL
  Filled 2013-08-26: qty 56

## 2013-08-26 MED ORDER — MISOPROSTOL 200 MCG PO TABS
800.0000 ug | ORAL_TABLET | Freq: Once | ORAL | Status: AC
  Administered 2013-08-26: 800 ug via VAGINAL

## 2013-08-26 MED ORDER — SENNOSIDES-DOCUSATE SODIUM 8.6-50 MG PO TABS
2.0000 | ORAL_TABLET | ORAL | Status: DC
  Administered 2013-08-26: 2 via ORAL
  Filled 2013-08-26: qty 2

## 2013-08-26 MED ORDER — ZOLPIDEM TARTRATE 5 MG PO TABS: 5.0000 mg | ORAL_TABLET | Freq: Every evening | ORAL | Status: DC | PRN

## 2013-08-26 NOTE — Progress Notes (Signed)
Clinical Social Work Department PSYCHOSOCIAL ASSESSMENT - MATERNAL/CHILD 08/26/2013  Patient:  Stacey George,Stacey George  Account Number:  [id number]  Admit Date:  08/24/2013  Marjo Bickerhilds Name:   Stacey George    Clinical Social Worker:  Theona Muhs, LCSW   Date/Time:  08/26/2013 03:30 PM  Date Referred:  08/26/2013   Referral source  Central Nursery     Referred reason  Depression/Anxiety   Other referral source:    I:  FAMILY / HOME ENVIRONMENT Child's legal guardian:  PARENT  Guardian - Name Guardian - Age Guardian - Address  Stacey George,Stacey George [address]  [place], [place] [address]   Other household support members/support persons Other support:   Maternal grandparents    II  PSYCHOSOCIAL DATA Information Source:    Event organiserinancial and Community Resources Employment:   Mother is employed   Surveyor, quantityinancial resources:  [employer]icaid If [place]:   Other  WIC   School / Grade:   Maternity Care Coordinator / Child Services Coordination / Early Interventions:  Cultural issues impacting care:    III  STRENGTHS Strengths  Home prepared for Child (including basic supplies)  Supportive family/friends  Adequate Resources   Strength comment:    IV  RISK FACTORS AND CURRENT PROBLEMS Current Problem:       V  SOCIAL WORK ASSESSMENT Acknowledged order for Social Work consult to assess mother's history of depression.  She is a single parent with no other dependents.  FOB is reportedly uninvolved and she expects no support from him.  Mother resides with maternal grandparents.  Maternal great grandparents were present during CSW and very attentive to newborn.  Mother is employed and reports plan to return to work.  She admits to hx of depression while in college and states that she received brief counseling at the time.  She denies any recurring episodes.  She denies any current symptoms. Spoke with her regarding signs/symptoms of PP Depression, and provided her with information on where she  could seek treatment if needed.  She denies any hx of substance abuse. No acute social concerns related at this time.    Mother informed of social work Surveyor, miningavailability.      VI SOCIAL WORK PLAN Social Work Plan  No Further Intervention Required / No Barriers to Discharge   Type of pt/family education:   PP Depression information and resource

## 2013-08-26 NOTE — Anesthesia Postprocedure Evaluation (Signed)
Anesthesia Post Note  Patient: Stacey George  Procedure(s) Performed: * No procedures listed *  Anesthesia type: Epidural  Patient location: Mother/Baby  Post pain: Pain level controlled  Post assessment: Post-op Vital signs reviewed  Last Vitals:  Filed Vitals:   08/26/13 0600  BP: 123/61  Pulse: 94  Temp: 37.2 C  Resp: 20    Post vital signs: Reviewed  Level of consciousness:alert  Complications: No apparent anesthesia complications

## 2013-08-26 NOTE — Progress Notes (Signed)
Delivery of live viable female at [id number] by Dr. Zonia KiefStephens and Judie PetitM. Mayford KnifeWilliams, CNM.

## 2013-08-27 MED ORDER — IBUPROFEN 600 MG PO TABS: 600.0000 mg | ORAL_TABLET | Freq: Four times a day (QID) | ORAL | Status: DC

## 2013-08-27 MED ORDER — RHO D IMMUNE GLOBULIN 1500 UNIT/2ML IJ SOSY
300.0000 ug | PREFILLED_SYRINGE | Freq: Once | INTRAMUSCULAR | Status: AC
  Administered 2013-08-27: 300 ug via INTRAMUSCULAR
  Filled 2013-08-27: qty 2

## 2013-08-27 NOTE — Discharge Summary (Signed)
Attestation of Attending Supervision of Advanced Practitioner (CNM/NP): Evaluation and management procedures were performed by the Advanced Practitioner under my supervision and collaboration.  I have reviewed the Advanced Practitioner's note and chart, and I agree with the management and plan.  Anairis Knick 08/27/2013 12:05 PM   

## 2013-08-27 NOTE — Discharge Instructions (Signed)
Vaginal Delivery, Care After  Refer to this sheet in the next few weeks. These discharge instructions provide you with information on caring for yourself after delivery. Your caregiver may also give you specific instructions. Your treatment has been planned according to the most current medical practices available, but problems sometimes occur. Call your caregiver if you have any problems or questions after you go home.  HOME CARE INSTRUCTIONS  Take over-the-counter or prescription medicines only as directed by your caregiver or pharmacist.  Do not drink alcohol, especially if you are breastfeeding or taking medicine to relieve pain.  Do not chew or smoke tobacco.  Do not use illegal drugs.  Continue to use good perineal care. Good perineal care includes:  Wiping your perineum from front to back.  Keeping your perineum clean. Do not use tampons or douche until your caregiver says it is okay.  Shower, wash your hair, and take tub baths as directed by your caregiver.  Wear a well-fitting bra that provides breast support.  Eat healthy foods.  Drink enough fluids to keep your urine clear or pale yellow.  Eat high-fiber foods such as whole grain cereals and breads, brown rice, beans, and fresh fruits and vegetables every day. These foods may help prevent or relieve constipation.  Follow your caregiver's recommendations regarding resumption of activities such as climbing stairs, driving, lifting, exercising, or traveling.  Talk to your caregiver about resuming sexual activities. Resumption of sexual activities is dependent upon your risk of infection, your rate of healing, and your comfort and desire to resume sexual activity.  Try to have someone help you with your household activities and your newborn for at least a few days after you leave the hospital.  Rest as much as possible. Try to rest or take a nap when your newborn is sleeping.  Increase your activities gradually.  Keep all of your  scheduled postpartum appointments. It is very important to keep your scheduled follow-up appointments. At these appointments, your caregiver will be checking to make sure that you are healing physically and emotionally. SEEK MEDICAL CARE IF:  You are passing large clots from your vagina. Save any clots to show your caregiver.  You have a foul smelling discharge from your vagina.  You have trouble urinating.  You are urinating frequently.  You have pain when you urinate.  You have a change in your bowel movements.  You have increasing redness, pain, or swelling near your vaginal incision (episiotomy) or vaginal tear.  You have pus draining from your episiotomy or vaginal tear.  Your episiotomy or vaginal tear is separating.  You have painful, hard, or reddened breasts.  You have a severe headache.  You have blurred vision or see spots.  You feel sad or depressed.  You have thoughts of hurting yourself or your newborn.  You have questions about your care, the care of your newborn, or medicines.  You are dizzy or light-headed.  You have a rash.  You have nausea or vomiting.  You were breastfeeding and have not had a menstrual period within 12 weeks after you stopped breastfeeding.  You are not breastfeeding and have not had a menstrual period by the 12th week after delivery.  You have a fever. SEEK IMMEDIATE MEDICAL CARE IF:  You have persistent pain.  You have chest pain.  You have shortness of breath.  You faint.  You have leg pain.  You have stomach pain.  Your vaginal bleeding saturates two or more sanitary pads in  1 hour. MAKE SURE YOU:  Understand these instructions.  Will watch your condition.  Will get help right away if you are not doing well or get worse. Document Released: 01/10/2000 Document Revised: 05/29/2013 Document Reviewed: 09/09/2011  [facility] Patient Information 2015 [place]. This information is not intended to replace advice given to you by your health  care provider. Make sure you discuss any questions you have with your health care provider.   Vaginal Delivery, Care After Refer to this sheet in the next few weeks. These discharge instructions provide you with information on caring for yourself after delivery. Your caregiver may also give you specific instructions. Your treatment has been planned according to the most current medical practices available, but problems sometimes occur. Call your caregiver if you have any problems or questions after you go home. HOME CARE INSTRUCTIONS  Take over-the-counter or prescription medicines only as directed by your caregiver or pharmacist.  Do not drink alcohol, especially if you are breastfeeding or taking medicine to relieve pain.  Do not chew or smoke tobacco.  Do not use illegal drugs.  Continue to use good perineal care. Good perineal care includes:  Wiping your perineum from front to back.  Keeping your perineum clean.  Do not use tampons or douche until your caregiver says it is okay.  Shower, wash your hair, and take tub baths as directed by your caregiver.  Wear a well-fitting bra that provides breast support.  Eat healthy foods.  Drink enough fluids to keep your urine clear or pale yellow.  Eat high-fiber foods such as whole grain cereals and breads, brown rice, beans, and fresh fruits and vegetables every day. These foods may help prevent or relieve constipation.  Follow your caregiver's recommendations regarding resumption of activities such as climbing stairs, driving, lifting, exercising, or traveling.  Talk to your caregiver about resuming sexual activities. Resumption of sexual activities is dependent upon your risk of infection, your rate of healing, and your comfort and desire to resume sexual activity.  Try to have someone help you with your household activities and your newborn for at least a few days after you leave the hospital.  Rest as much as possible. Try to  rest or take a nap when your newborn is sleeping.  Increase your activities gradually.  Keep all of your scheduled postpartum appointments. It is very important to keep your scheduled follow-up appointments. At these appointments, your caregiver will be checking to make sure that you are healing physically and emotionally. SEEK MEDICAL CARE IF:   You are passing large clots from your vagina. Save any clots to show your caregiver.  You have a foul smelling discharge from your vagina.  You have trouble urinating.  You are urinating frequently.  You have pain when you urinate.  You have a change in your bowel movements.  You have increasing redness, pain, or swelling near your vaginal incision (episiotomy) or vaginal tear.  You have pus draining from your episiotomy or vaginal tear.  Your episiotomy or vaginal tear is separating.  You have painful, hard, or reddened breasts.  You have a severe headache.  You have blurred vision or see spots.  You feel sad or depressed.  You have thoughts of hurting yourself or your newborn.  You have questions about your care, the care of your newborn, or medicines.  You are dizzy or light-headed.  You have a rash.  You have nausea or vomiting.  You were breastfeeding and have not had a  menstrual period within 12 weeks after you stopped breastfeeding.  You are not breastfeeding and have not had a menstrual period by the 12th week after delivery.  You have a fever. SEEK IMMEDIATE MEDICAL CARE IF:   You have persistent pain.  You have chest pain.  You have shortness of breath.  You faint.  You have leg pain.  You have stomach pain.  Your vaginal bleeding saturates two or more sanitary pads in 1 hour. MAKE SURE YOU:   Understand these instructions.  Will watch your condition.  Will get help right away if you are not doing well or get worse. Document Released: 01/10/2000 Document Revised: 05/29/2013 Document Reviewed:  09/09/2011 [facility] Patient Information 2015 [place], [place]. This information is not intended to replace advice given to you by your health care provider. Make sure you discuss any questions you have with your health care provider.

## 2013-08-27 NOTE — Lactation Note (Signed)
This note was copied from the chart of Stacey Claybon Jabsara Barsch. Lactation Consultation Note Mom holding baby; states baby just fed; mom states breastfeeding has been going much better; states no concerns; denies breast, nipple pain. Mom was instructed in the prevention and treatment of engorgement and sore nipples. Mom was encouraged to call lactation department if she has any questions or concerns and to attend the breastfeeding support group.   Patient Name: Stacey Claybon Jabsara Brandi YQMVH'[id number]'s Date: 08/27/2013     Maternal Data    Feeding Feeding Type: Breast Fed Length of feed: 20 min  LATCH Score/Interventions                      Lactation Tools Discussed/Used     Consult Status      Lenard ForthSanders, Marria Mathison Fulmer 08/27/2013, 11:43 AM

## 2013-08-27 NOTE — Discharge Summary (Signed)
Obstetric Discharge Summary Reason for Admission: Induction of labor for prolonged pregnancy Prenatal Procedures: none Intrapartum Procedures: spontaneous vaginal delivery Postpartum Procedures: none Complications-Operative and Postpartum: vaginal laceration Hemoglobin  Date Value Ref Range Status  08/26/2013 7.2* 12.0 - 15.0 g/dL Final     HCT  Date Value Ref Range Status  08/26/2013 22.9* 36.0 - 46.0 % Final   28 year old G1P1001 at 2036w3d who was admitted for induction of labor, now s/p vaginal delivery. She underwent cervical ripening with foley bulb followed by pitocin for induction. Patient had good response to induction and had vaginal delivery at 0240 on 08/26/2013. Delivery was complicated by shoulder dystocia that was delivered after 45 seconds with suprapubic pressure. There was a posterior vaginal laceration that was repaired with 3-0 vicryl in simple 2 layer closure. Bleeding was 350mL at delivery and minimal after delivery. Pain was controlled. Patient had good recovery in the next 24 hours. She declined any contraception at discharge. Followup with health department in 4-6 weeks.  Physical Exam:  General: alert, cooperative and no distress Lochia: appropriate Uterine Fundus: firm Incision: n/a DVT Evaluation: No evidence of DVT seen on physical exam. Negative Homan's sign. No cords or calf tenderness.  Discharge Diagnoses: Term Pregnancy-delivered  Discharge Information: Date: 08/27/2013 Activity: pelvic rest Diet: routine Medications: Ibuprofen Condition: stable Instructions: Contact physician for any worsening pain, bleeding, fever, nausea Discharge to: home Follow-up Information   Follow up with PRATT,TANYA S, MD. Schedule an appointment as soon as possible for a visit in 4 weeks.   Specialty:  Obstetrics and Gynecology   Contact information:   [address] [place] [address] [phone]       Newborn Data: Live born female  Birth Weight: 8 lb 0.8 oz  (3650 g) APGAR: 7, 8  Home with mother.  Patient seen with Shaniece Bussa Verne GrainPoe Stephens, Devin A 08/27/2013, 7:49 AM  Evaluation and management procedures were performed by Resident physician under my supervision/collaboration. Chart reviewed, patient examined by me and I agree with management and plan. Danae Orleanseirdre C Leidi Astle, CNM 08/27/2013 9:57 AM

## 2013-08-28 LAB — TYPE AND SCREEN
ABO/RH(D): A NEG
Antibody Screen: POSITIVE
DAT, IgG: NEGATIVE
UNIT DIVISION: 0
Unit division: 0

## 2013-08-28 LAB — RH IG WORKUP (INCLUDES ABO/RH)
ABO/RH(D): A NEG
Fetal Screen: NEGATIVE
Gestational Age(Wks): 41.3
UNIT DIVISION: 0

## 2013-08-30 ENCOUNTER — Inpatient Hospital Stay (HOSPITAL_COMMUNITY)
Admission: AD | Admit: 2013-08-30 | Discharge: 2013-08-30 | Disposition: A | Discharge status: Home / Self Care | Payer: Medicaid Other | Source: Ambulatory Visit | Attending: Obstetrics & Gynecology | Admitting: Obstetrics & Gynecology

## 2013-08-30 ENCOUNTER — Encounter (HOSPITAL_COMMUNITY): Payer: Self-pay | Admitting: *Deleted

## 2013-08-30 DIAGNOSIS — O9081 Anemia of the puerperium: Secondary | ICD-10-CM

## 2013-08-30 DIAGNOSIS — R51 Headache: Secondary | ICD-10-CM | POA: Diagnosis present

## 2013-08-30 DIAGNOSIS — O99893 Other specified diseases and conditions complicating puerperium: Secondary | ICD-10-CM | POA: Diagnosis not present

## 2013-08-30 DIAGNOSIS — O1205 Gestational edema, complicating the puerperium: Secondary | ICD-10-CM

## 2013-08-30 DIAGNOSIS — O9989 Other specified diseases and conditions complicating pregnancy, childbirth and the puerperium: Principal | ICD-10-CM

## 2013-08-30 DIAGNOSIS — D649 Anemia, unspecified: Secondary | ICD-10-CM

## 2013-08-30 DIAGNOSIS — R300 Dysuria: Secondary | ICD-10-CM | POA: Insufficient documentation

## 2013-08-30 LAB — URINALYSIS, ROUTINE W REFLEX MICROSCOPIC
BILIRUBIN URINE: NEGATIVE
Bilirubin Urine: NEGATIVE
GLUCOSE, UA: NEGATIVE mg/dL
Glucose, UA: NEGATIVE mg/dL
HGB URINE DIPSTICK: NEGATIVE
KETONES UR: NEGATIVE mg/dL
Ketones, ur: NEGATIVE mg/dL
Leukocytes, UA: NEGATIVE
Nitrite: NEGATIVE
Nitrite: NEGATIVE
PH: 6.5 (ref 5.0–8.0)
PROTEIN: 100 mg/dL — AB
Protein, ur: NEGATIVE mg/dL
Specific Gravity, Urine: 1.01 (ref 1.005–1.030)
Specific Gravity, Urine: 1.015 (ref 1.005–1.030)
Urobilinogen, UA: 0.2 mg/dL (ref 0.0–1.0)
Urobilinogen, UA: 0.2 mg/dL (ref 0.0–1.0)
pH: 6 (ref 5.0–8.0)

## 2013-08-30 LAB — CBC
HEMATOCRIT: 21.1 % — AB (ref 36.0–46.0)
Hemoglobin: 6.4 g/dL — CL (ref 12.0–15.0)
MCH: 22.5 pg — ABNORMAL LOW (ref 26.0–34.0)
MCHC: 30.3 g/dL (ref 30.0–36.0)
MCV: 74 fL — ABNORMAL LOW (ref 78.0–100.0)
PLATELETS: 364 10*3/uL (ref 150–400)
RBC: 2.85 MIL/uL — ABNORMAL LOW (ref 3.87–5.11)
RDW: 17.6 % — AB (ref 11.5–15.5)
WBC: 9.5 10*3/uL (ref 4.0–10.5)

## 2013-08-30 LAB — COMPREHENSIVE METABOLIC PANEL
ALK PHOS: 124 U/L — AB (ref 39–117)
ALT: 21 U/L (ref 0–35)
AST: 22 U/L (ref 0–37)
Albumin: 2.7 g/dL — ABNORMAL LOW (ref 3.5–5.2)
Anion gap: 12 (ref 5–15)
BUN: 15 mg/dL (ref 6–23)
CHLORIDE: 105 meq/L (ref 96–112)
CO2: 21 mEq/L (ref 19–32)
Calcium: 8.4 mg/dL (ref 8.4–10.5)
Creatinine, Ser: 0.64 mg/dL (ref 0.50–1.10)
GFR calc Af Amer: >90 mL/min (ref >90)
GFR calc non Af Amer: >90 mL/min (ref >90)
Glucose, Bld: 98 mg/dL (ref 70–99)
POTASSIUM: 3.8 meq/L (ref 3.7–5.3)
SODIUM: 138 meq/L (ref 137–147)
TOTAL PROTEIN: 6.4 g/dL (ref 6.0–8.3)

## 2013-08-30 LAB — URINE MICROSCOPIC-ADD ON

## 2013-08-30 LAB — PROTEIN / CREATININE RATIO, URINE
Creatinine, Urine: 114.58 mg/dL
PROTEIN CREATININE RATIO: 0.06 (ref 0.00–0.15)
Total Protein, Urine: 7.2 mg/dL

## 2013-08-30 MED ORDER — FERROUS SULFATE 325 (65 FE) MG PO TABS: 325.0000 mg | ORAL_TABLET | Freq: Three times a day (TID) | ORAL | Status: AC

## 2013-08-30 NOTE — MAU Provider Note (Signed)
Chief Complaint: Headache and Edema   First Provider Initiated Contact with Patient 08/30/13 1948     SUBJECTIVE HPI: Stacey George is a 28 y.o. G1P1001 who presents to maternity admissions on Day 4 postpartum following NSVD reporting headache with onset today and swelling of both ankles and feet worsening since discharge from the hospital.  She reports labor lasted 36 hours and she had continuous IV fluids during that time.  She reports moderate lochia, mild abdominal cramping, and denies vaginal itching/burning, urinary symptoms, dizziness, n/v, or fever/chills.     Past Medical History  Diagnosis Date  . Medical history non-contributory   . Asthma     exercise induced  . Depression   . Hyperthyroidism    Past Surgical History  Procedure Laterality Date  . Rotator cuff repair  2002 and 2004   History   Social History  . Marital Status: Single    Spouse Name: N/A    Number of Children: N/A  . Years of Education: N/A   Occupational History  . Not on file.   Social History Main Topics  . Smoking status: Former Games developer  . Smokeless tobacco: Not on file  . Alcohol Use: No  . Drug Use: No  . Sexual Activity: Not on file   Other Topics Concern  . Not on file   Social History Narrative  . No narrative on file   No current facility-administered medications on file prior to encounter.   Current Outpatient Prescriptions on File Prior to Encounter  Medication Sig Dispense Refill  . calcium carbonate (TUMS - DOSED IN MG ELEMENTAL CALCIUM) 500 MG chewable tablet Chew 3 tablets by mouth 5 (five) times daily as needed for indigestion or heartburn.       . Prenatal Vit-Fe Fumarate-FA (MULTIVITAMIN-PRENATAL) 27-0.8 MG TABS tablet Take 1 tablet by mouth daily.        Allergies  Allergen Reactions  . Codeine Nausea Only    ROS: Pertinent items in HPI  OBJECTIVE Blood pressure 132/90, pulse 79, temperature 98.6 F (37 C), temperature source Oral, resp. rate 18, height 5\' 4"   (1.626 m), weight 94.257 kg (207 lb 12.8 oz), last menstrual period 11/09/2012, unknown if currently breastfeeding. Patient Vitals for the past 24 hrs:  BP Temp Temp src Pulse Resp Height Weight  08/30/13 1727 132/90 mmHg 98.6 F (37 C) Oral 79 18 5\' 4"  (1.626 m) 94.257 kg (207 lb 12.8 oz)   GENERAL: Well-developed, well-nourished female in no acute distress.  HEENT: Normocephalic HEART: normal rate, heart sounds, regular rhythm RESP: normal effort, lung sounds clear and equal bilaterally ABDOMEN: Soft, non-tender EXTREMITIES: Nontender, +2 nonpitting edema BLE, DTRs +2 NEURO: Alert and oriented   LAB RESULTS Results for orders placed during the hospital encounter of 08/30/13 (from the past 24 hour(s))  URINALYSIS, ROUTINE W REFLEX MICROSCOPIC     Status: Abnormal   Collection Time    08/30/13  5:30 PM      Result Value Ref Range   Color, Urine RED (*) YELLOW   APPearance CLOUDY (*) CLEAR   Specific Gravity, Urine 1.010  1.005 - 1.030   pH 6.5  5.0 - 8.0   Glucose, UA NEGATIVE  NEGATIVE mg/dL   Hgb urine dipstick LARGE (*) NEGATIVE   Bilirubin Urine NEGATIVE  NEGATIVE   Ketones, ur NEGATIVE  NEGATIVE mg/dL   Protein, ur 409 (*) NEGATIVE mg/dL   Urobilinogen, UA 0.2  0.0 - 1.0 mg/dL   Nitrite NEGATIVE  NEGATIVE  Leukocytes, UA LARGE (*) NEGATIVE  URINE MICROSCOPIC-ADD ON     Status: Abnormal   Collection Time    08/30/13  5:30 PM      Result Value Ref Range   Squamous Epithelial / LPF FEW (*) RARE   WBC, UA 21-50  <3 WBC/hpf   RBC / HPF TOO NUMEROUS TO COUNT  <3 RBC/hpf   Bacteria, UA FEW (*) RARE  CBC     Status: Abnormal   Collection Time    08/30/13  7:00 PM      Result Value Ref Range   WBC 9.5  4.0 - 10.5 K/uL   RBC 2.85 (*) 3.87 - 5.11 MIL/uL   Hemoglobin 6.4 (*) 12.0 - 15.0 g/dL   HCT 62.1 (*) 30.8 - 65.7 %   MCV 74.0 (*) 78.0 - 100.0 fL   MCH 22.5 (*) 26.0 - 34.0 pg   MCHC 30.3  30.0 - 36.0 g/dL   RDW 84.6 (*) 96.2 - 95.2 %   Platelets 364  150 - 400 K/uL   COMPREHENSIVE METABOLIC PANEL     Status: Abnormal   Collection Time    08/30/13  7:00 PM      Result Value Ref Range   Sodium 138  137 - 147 mEq/L   Potassium 3.8  3.7 - 5.3 mEq/L   Chloride 105  96 - 112 mEq/L   CO2 21  19 - 32 mEq/L   Glucose, Bld 98  70 - 99 mg/dL   BUN 15  6 - 23 mg/dL   Creatinine, Ser 8.41  0.50 - 1.10 mg/dL   Calcium 8.4  8.4 - 32.4 mg/dL   Total Protein 6.4  6.0 - 8.3 g/dL   Albumin 2.7 (*) 3.5 - 5.2 g/dL   AST 22  0 - 37 U/L   ALT 21  0 - 35 U/L   Alkaline Phosphatase 124 (*) 39 - 117 U/L   Total Bilirubin <0.2 (*) 0.3 - 1.2 mg/dL   GFR calc non Af Amer >90  >90 mL/min   GFR calc Af Amer >90  >90 mL/min   Anion gap 12  5 - 15  URINALYSIS, ROUTINE W REFLEX MICROSCOPIC     Status: None   Collection Time    08/30/13  8:05 PM      Result Value Ref Range   Color, Urine YELLOW  YELLOW   APPearance CLEAR  CLEAR   Specific Gravity, Urine 1.015  1.005 - 1.030   pH 6.0  5.0 - 8.0   Glucose, UA NEGATIVE  NEGATIVE mg/dL   Hgb urine dipstick NEGATIVE  NEGATIVE   Bilirubin Urine NEGATIVE  NEGATIVE   Ketones, ur NEGATIVE  NEGATIVE mg/dL   Protein, ur NEGATIVE  NEGATIVE mg/dL   Urobilinogen, UA 0.2  0.0 - 1.0 mg/dL   Nitrite NEGATIVE  NEGATIVE   Leukocytes, UA NEGATIVE  NEGATIVE  PROTEIN / CREATININE RATIO, URINE     Status: None   Collection Time    08/30/13  8:05 PM      Result Value Ref Range   Creatinine, Urine 114.58     Total Protein, Urine 7.2     PROTEIN CREATININE RATIO 0.06  0.00 - 0.15   MAU management: Initial U/A contaminated with lochia. I/O cath urine with repeat u/a and P/C ratio.  ASSESSMENT 1. Postpartum anemia   2. Postpartum edema     PLAN Discharge home with preeclampsia precautions Add Ferrous sulfate TID Tylenol/Ibuprofen for h/a as  needed [facility] Return to MAU as needed for emergencies    Medication List         calcium carbonate 500 MG chewable tablet  Commonly known as:  TUMS - dosed in mg  elemental calcium  Chew 3 tablets by mouth 5 (five) times daily as needed for indigestion or heartburn.     ferrous sulfate 325 (65 FE) MG tablet  Take 1 tablet (325 mg total) by mouth 3 (three) times daily with meals.     ibuprofen 600 MG tablet  Commonly known as:  ADVIL,MOTRIN  Take 600 mg by mouth every 6 (six) hours as needed for headache.     multivitamin-prenatal 27-0.8 MG Tabs tablet  Take 1 tablet by mouth daily.       Follow-up Information   [facility] [place] (As scheduled)    Contact information:   [address] [place] [address] [phone]      Sharen CounterLisa Leftwich-Kirby Certified Nurse-Midwife 08/30/2013  9:35 PM

## 2013-08-30 NOTE — Progress Notes (Signed)
Pt states she headache is worse when she is up and moving around.pt states headache is better when she is sitting down

## 2013-08-30 NOTE — MAU Note (Signed)
CRITICAL VALUE ALERT  Critical value received:  Hgb 6.4  Date of notification: 08/30/2013  Time of notification: 1930  Critical value read back:Yes.    Nurse who received alert:  Alisia FerrariBelinda Cabe Lashley RN  MD notified (1st page Sharen CounterLisa Leftwich-Kirby  Time of first page:  1931  MD notified (2nd page):  Time of second page:  Responding MD:  L.leftwich Craige Cotta-kirby   Time MD responded:  08/30/2013

## 2013-08-30 NOTE — MAU Note (Signed)
Post partum 08/26/13 report headache for a few days and increased swelling. Had some elevated b/p during pregnancy. Instructed to come to mau by health dept. C/o burning with urination also

## 2013-08-30 NOTE — Discharge Instructions (Signed)
Anemia, Nonspecific Anemia is a condition in which the concentration of red blood cells or hemoglobin in the blood is below normal. Hemoglobin is a substance in red blood cells that carries oxygen to the tissues of the body. Anemia results in not enough oxygen reaching these tissues.  CAUSES  Common causes of anemia include:   Excessive bleeding. Bleeding may be internal or external. This includes excessive bleeding from periods (in women) or from the intestine.   Poor nutrition.   Chronic kidney, thyroid, and liver disease.  Bone marrow disorders that decrease red blood cell production.  Cancer and treatments for cancer.  HIV, AIDS, and their treatments.  Spleen problems that increase red blood cell destruction.  Blood disorders.  Excess destruction of red blood cells due to infection, medicines, and autoimmune disorders. SIGNS AND SYMPTOMS   Minor weakness.   Dizziness.   Headache.  Palpitations.   Shortness of breath, especially with exercise.   Paleness.  Cold sensitivity.  Indigestion.  Nausea.  Difficulty sleeping.  Difficulty concentrating. Symptoms may occur suddenly or they may develop slowly.  DIAGNOSIS  Additional blood tests are often needed. These help your health care provider determine the best treatment. Your health care provider will check your stool for blood and look for other causes of blood loss.  TREATMENT  Treatment varies depending on the cause of the anemia. Treatment can include:   Supplements of iron, vitamin B12, or folic acid.   Hormone medicines.   A blood transfusion. This may be needed if blood loss is severe.   Hospitalization. This may be needed if there is significant continual blood loss.   Dietary changes.  Spleen removal. HOME CARE INSTRUCTIONS Keep all follow-up appointments. It often takes many weeks to correct anemia, and having your health care provider check on your condition and your response to  treatment is very important. SEEK IMMEDIATE MEDICAL CARE IF:   You develop extreme weakness, shortness of breath, or chest pain.   You become dizzy or have trouble concentrating.  You develop heavy vaginal bleeding.   You develop a rash.   You have bloody or black, tarry stools.   You faint.   You vomit up blood.   You vomit repeatedly.   You have abdominal pain.  You have a fever or persistent symptoms for more than 2-3 days.   You have a fever and your symptoms suddenly get worse.   You are dehydrated.  MAKE SURE YOU:  Understand these instructions.  Will watch your condition.  Will get help right away if you are not doing well or get worse. Document Released: 02/20/2004 Document Revised: 09/14/2012 Document Reviewed: 07/08/2012 [employer] Patient Information 2015 [employer]. This information is not intended to replace advice given to you by your health care provider. Make sure you discuss any questions you have with your health care provider.  

## 2013-08-30 NOTE — MAU Note (Signed)
Pt states she has been feeling bad for couple of days. Pt  Mom states pt passed a clot about the size of your fist. Pt states she had a lot swelling in the legs and that is more than she had when she was pregnant

## 2013-09-01 NOTE — MAU Provider Note (Signed)
Attestation of Attending Supervision of Advanced Practitioner (CNM/NP): Evaluation and management procedures were performed by the Advanced Practitioner under my supervision and collaboration.  I have reviewed the Advanced Practitioner's note and chart, and I agree with the management and plan.  HARRAWAY-SMITH, Yonael Tulloch 9:57 AM      

## 2013-11-27 ENCOUNTER — Encounter (HOSPITAL_COMMUNITY): Payer: Self-pay | Admitting: *Deleted

## 2015-02-01 IMAGING — US US FETAL BPP W/O NONSTRESS
1 series · 13 of 21 positions shown · non-contrast
Comparison: none

OBSTETRICS REPORT
                      (Signed Final 08/21/2013 [DATE])

Service(s) Provided
Indications
 Postdate pregnancy (40-42 weeks)
Fetal Evaluation
 Num Of Fetuses:    1
 Fetal Heart Rate:  160                          bpm
 Cardiac Activity:  Observed
 Presentation:      Cephalic
 Placenta:          Anterior, above cervical os
 Amniotic Fluid
 AFI FV:      Subjectively within normal limits
 AFI Sum:     11.83   cm       50  %Tile     Larg Pckt:     4.9  cm
 RUQ:   4.9     cm   RLQ:    4.38   cm    LUQ:   1.41    cm   LLQ:    1.14   cm
Biophysical Evaluation
 Amniotic F.V:   Within normal limits       F. Tone:        Observed
 F. Movement:    Observed                   Score:          [DATE]
 F. Breathing:   Observed
Gestational Age
 Clinical EDD:  42w 2d                                        EDD:   08/05/13
 Best:          40w 5d     Det. By:  U/S (04/18/13)           EDD:   08/16/13
Impression
INDICATION: 27 yr old G1P0 at 28w8d for BPP and AFI.
 Remote read.

[Series 1: us fetal bpp w/o nonstress · non-contrast · 21 acquisitions, 13 frames shown]
[im 1/21]
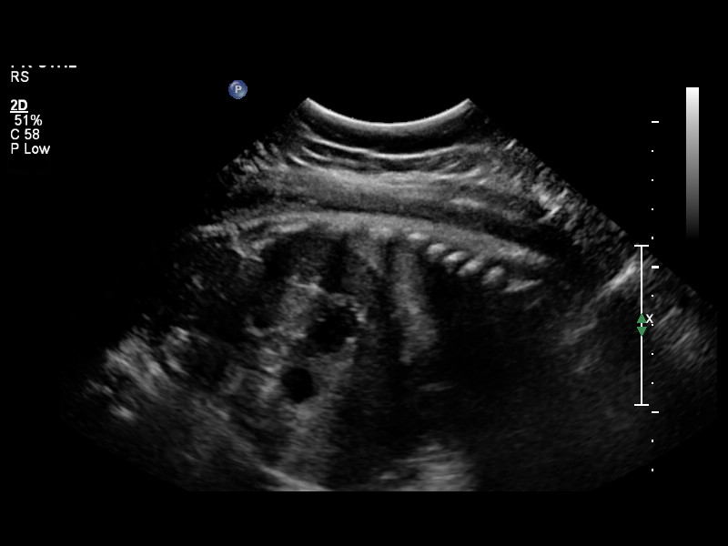
[im 3/21]
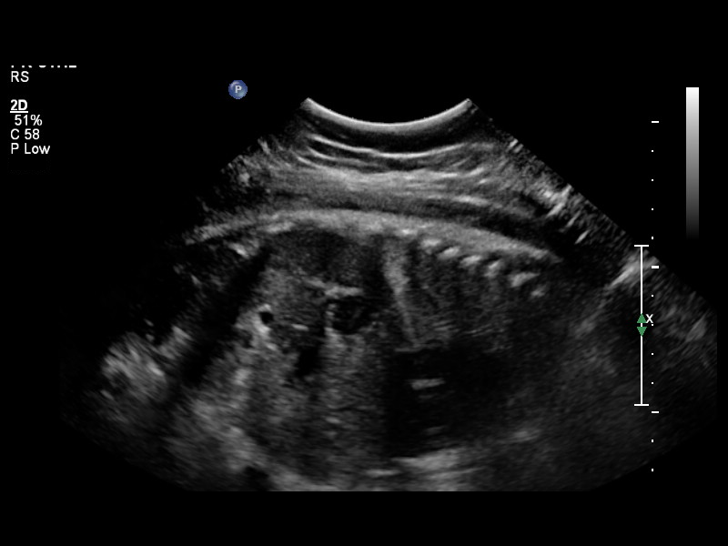
[im 5/21]
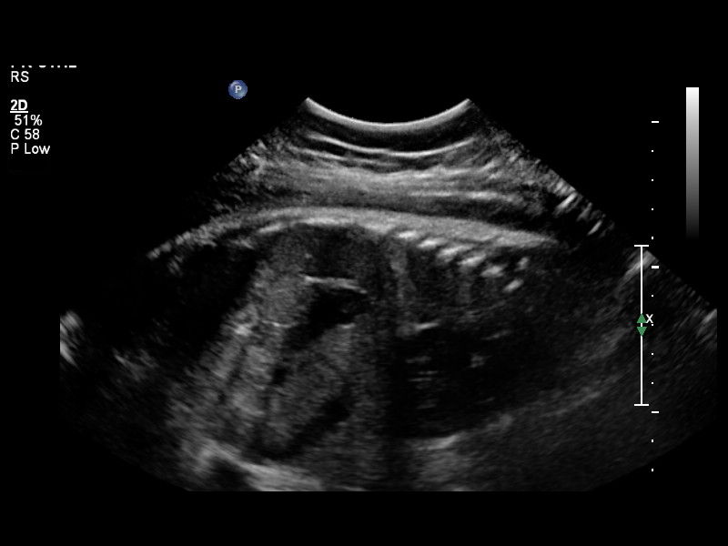
[im 6/21]
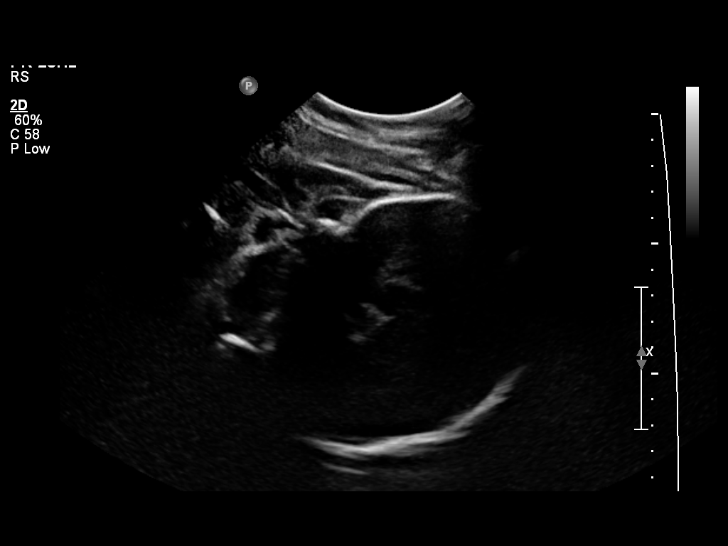
[im 8/21]
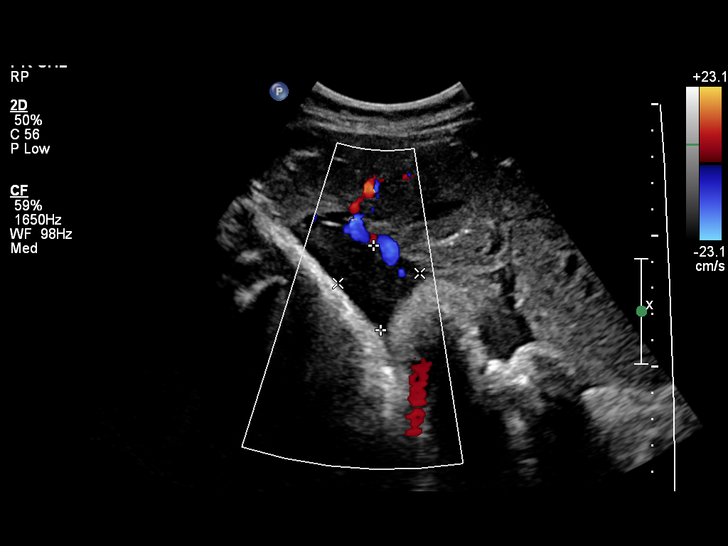
[im 9/21]
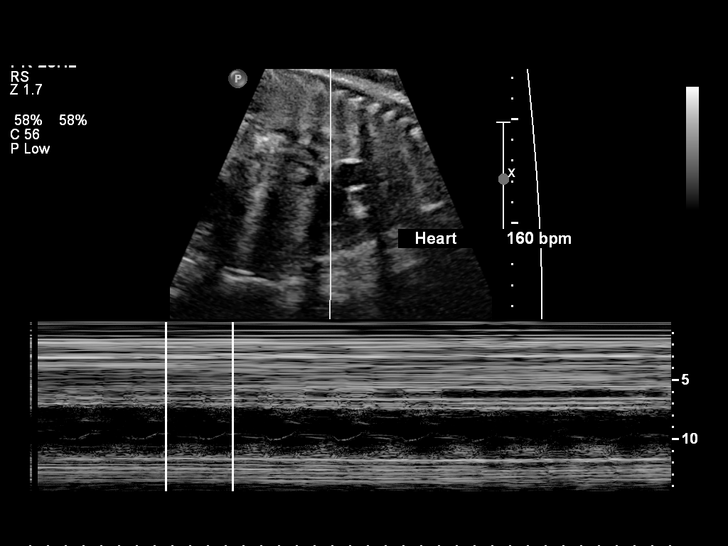
[im 11/21]
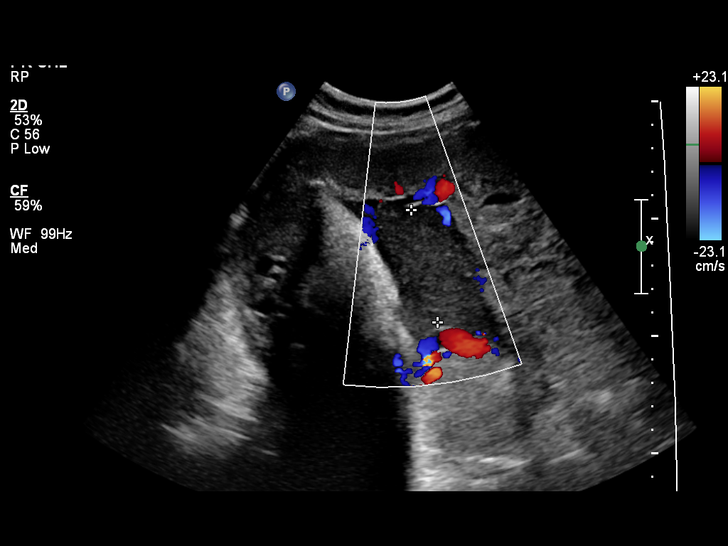
[im 13/21]
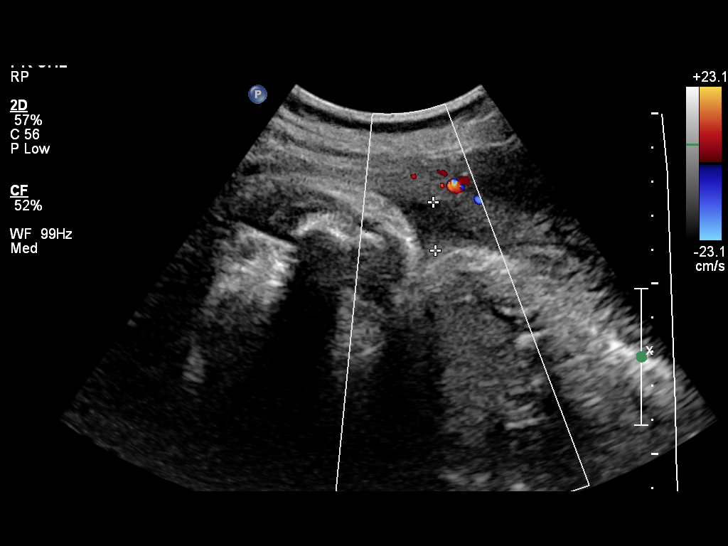
[im 14/21]
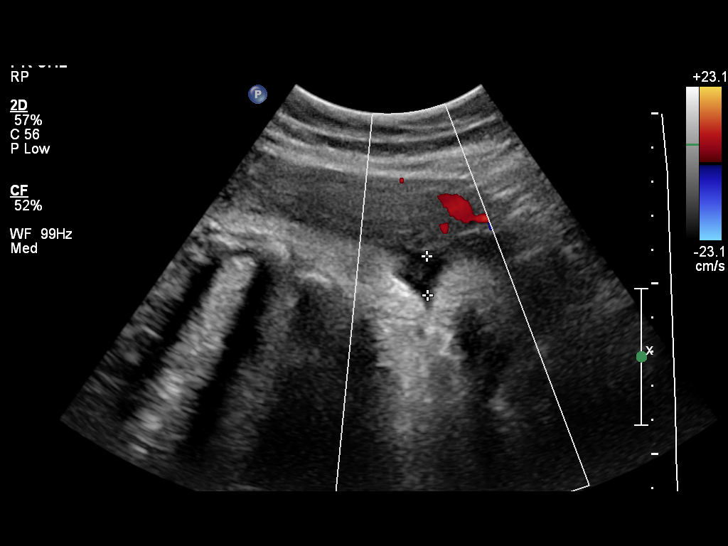
[im 16/21]
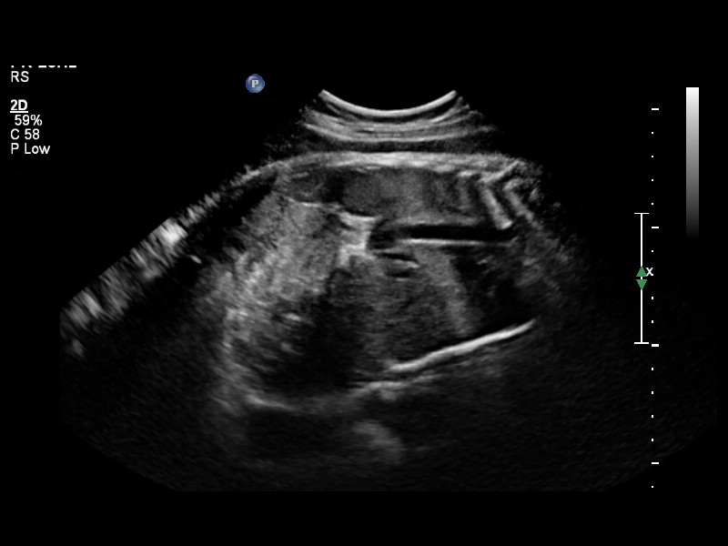
[im 17/21]
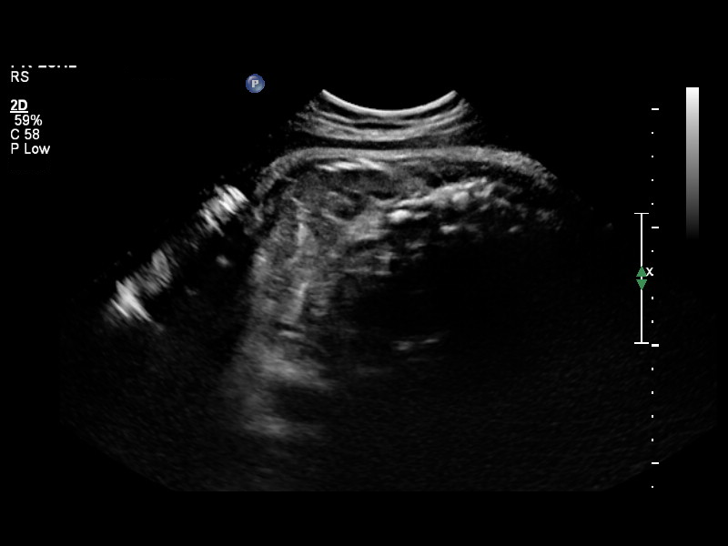
[im 19/21]
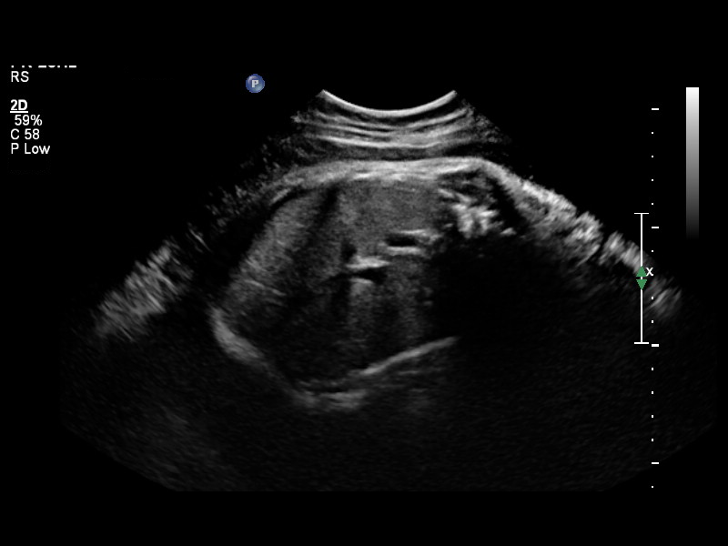
[im 21/21]
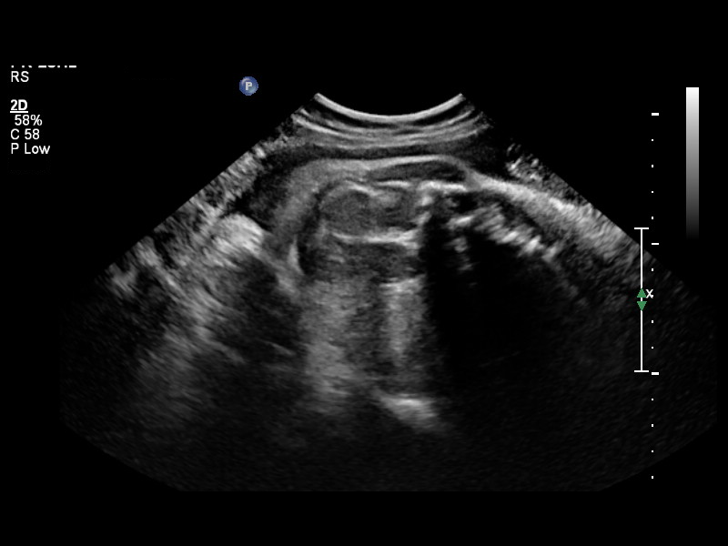

[13 of 21 positions shown; findings below may reference images not displayed]

FINDINGS: 1. Single intrauterine pregnancy.
 2. Anterior placenta without evidence of previa.
 3. Normal amniotic fluid index.
 4. Normal biophysical profile of [DATE].
Recommendations

 1. Normal BPP and AFI.
 2. Management per primary OB.
 questions or concerns.
